# Patient Record
Sex: Female | Born: 1980 | Race: Asian | Hispanic: No | Marital: Married | Smoking: Never smoker
Health system: Southern US, Community
[De-identification: ages and names within clinical notes are randomized; demographics above are authoritative.]

## PROBLEM LIST (undated history)

## (undated) DIAGNOSIS — Z789 Other specified health status: Secondary | ICD-10-CM

## (undated) HISTORY — PX: HEMORRHOID SURGERY: SHX153

## (undated) HISTORY — PX: INDUCED ABORTION: SHX677

---

## 2008-05-04 ENCOUNTER — Emergency Department (HOSPITAL_COMMUNITY): Admission: EM | Admit: 2008-05-04 | Discharge: 2008-05-04 | Payer: Self-pay | Admitting: Emergency Medicine

## 2010-04-23 ENCOUNTER — Encounter: Payer: Self-pay | Admitting: Internal Medicine

## 2010-04-23 LAB — CONVERTED CEMR LAB: Pap Smear: NORMAL

## 2010-11-03 NOTE — L&D Delivery Note (Signed)
Patient was C/C/+2 and pushed for 5 minutes with epidural.   NSVD  female infant, Apgars 9,9, weight P.   The patient had one 2nd degree midline lacerations repaired with 2-0 vicryl R. Fundus was firm. EBL was expected. Placenta was delivered intact. Vagina was clear.  Baby was vigorous to bedside.  Phyllis Reed

## 2010-12-17 ENCOUNTER — Encounter: Payer: Self-pay | Admitting: Internal Medicine

## 2010-12-17 ENCOUNTER — Ambulatory Visit (INDEPENDENT_AMBULATORY_CARE_PROVIDER_SITE_OTHER): Payer: BC Managed Care – PPO | Admitting: Internal Medicine

## 2010-12-17 DIAGNOSIS — Z331 Pregnant state, incidental: Secondary | ICD-10-CM

## 2010-12-17 DIAGNOSIS — H6690 Otitis media, unspecified, unspecified ear: Secondary | ICD-10-CM

## 2010-12-20 LAB — RUBELLA ANTIBODY, IGM: Rubella: IMMUNE

## 2010-12-20 LAB — ANTIBODY SCREEN: Antibody Screen: NEGATIVE

## 2010-12-20 LAB — HEPATITIS B SURFACE ANTIGEN: Hepatitis B Surface Ag: NEGATIVE

## 2010-12-20 LAB — ABO/RH: RH Type: POSITIVE

## 2010-12-25 NOTE — Assessment & Plan Note (Signed)
Summary: New patient   Vital Signs:  Patient profile:   30 year old female Menstrual status:  regular LMP:     10/17/2010 Height:      63.25 inches Weight:      134 pounds BMI:     23.63 O2 Sat:      97 % on Room air Temp:     97.8 degrees F oral Pulse rate:   88 / minute Resp:     18 per minute BP sitting:   100 / 70  (right arm)  Vitals Entered By: Glendell Docker CMA (December 17, 2010 9:35 AM)  O2 Flow:  Room air CC: New Patient  Is Patient Diabetic? No Pain Assessment Patient in pain? no      Comments c/o nasal, head congestion , headches, [redacted] weeks pregnant, taken tylenol and Robitussin with little relief, ongoing for the past 2 weeks,  LMP (date): 10/17/2010     Menstrual Status regular Enter LMP: 10/17/2010 Last PAP Result normal   Primary Care Provider:  Dondra Spry DO  CC:  New Patient .  History of Present Illness: 30 y/o Asian female to establish she c/o cold symptoms sinus congestion, green nasal discharge headaches - top of head two tylenol helped helped left ear pain fever - 101 pt is [redacted] weeks pregnant  prev lived at [facility] [place] moved to [place] - closer to family  2nd child  normal 1st pregnancy no hx of preeclampsia Ethan had jaundice as infant      Preventive Screening-Counseling & Management  Alcohol-Tobacco     Alcohol drinks/day: 0     Smoking Status: never  Caffeine-Diet-Exercise     Caffeine use/day: 1-2 beverages daily     Does Patient Exercise: no  Allergies (verified): No Known Drug Allergies  Past History:  Past Surgical History: Denies surgical history  Family History: Family History Diabetes-mother Family History Hypertension-mother Family History of Stroke -father age 56  Social History: Occupation:  Probation officer Married 2.5 years 1 child and one on the waySmoking Status:  never Caffeine use/day:  1-2 beverages daily Does Patient Exercise:  no  Review of Systems       The  patient complains of fever.  The patient denies chest pain, hemoptysis, and severe indigestion/heartburn.    Physical Exam  General:  alert, well-developed, and well-nourished.   Head:  normocephalic and atraumatic.   Eyes:  pupils equal, pupils round, and pupils reactive to light.   Ears:  R ear normal.  Left TM red and retracted Mouth:  good dentition and pharyngeal erythema.   Neck:  No deformities, masses, or tenderness noted. Lungs:  normal respiratory effort, normal breath sounds, no crackles, and no wheezes.   Heart:  normal rate, regular rhythm, no murmur, and no gallop.   Abdomen:  soft, non-tender, normal bowel sounds, no masses, no hepatomegaly, and no splenomegaly.   Extremities:  No lower extremity edema Neurologic:  cranial nerves II-XII intact and gait normal.   Psych:  normally interactive and good eye contact.     Impression & Recommendations:  Problem # 1:  OTITIS MEDIA, LEFT (ICD-382.9)  Her updated medication list for this problem includes:    Cefuroxime Axetil 500 Mg Tabs (Cefuroxime axetil) ..... One by mouth two times a day  take abx as directed. Call if no improvement in 48-72 hours or sooner if worsening symptoms.   Problem # 2:  PREGNANCY (ICD-V22.2) pt taking pre natal vitamin  she has upcoming appt with OB I rec pt get flu vaccine  Complete Medication List: 1)  Prenatal Vitamins 0.8 Mg Tabs (Prenatal multivit-min-fe-fa) .... Take 1 tablet by mouth once a day 2)  Cefuroxime Axetil 500 Mg Tabs (Cefuroxime axetil) .... One by mouth two times a day  Patient Instructions: 1)  Call our office if your symptoms do not  improve or gets worse. 2)  Increase fluid intake Prescriptions: CEFUROXIME AXETIL 500 MG TABS (CEFUROXIME AXETIL) one by mouth two times a day  #20 x 0   Entered and Authorized by:   D. Thomos Lemons DO   Signed by:   D. Thomos Lemons DO on 12/17/2010   Method used:   Electronically to        [facility] [phone]* (retail)       [address]       [place], [place]  [address]       Ph: [phone]       Fax: [phone]   RxID:   [id number]    Orders Added: 1)  New Patient Level III [99203]   Immunization History:  Tetanus/Td Immunization History:    Tetanus/Td:  historical (04/19/2002)   Immunization History:  Tetanus/Td Immunization History:    Tetanus/Td:  Historical (04/19/2002)  Current Allergies (reviewed today): No known allergies     Preventive Care Screening  Pap Smear:    Date:  04/23/2010    Results:  normal   Last Tetanus Booster:    Date:  04/19/2002    Results:  Historical

## 2011-06-26 LAB — STREP B DNA PROBE: GBS: NEGATIVE

## 2011-07-26 ENCOUNTER — Encounter (HOSPITAL_COMMUNITY): Payer: Self-pay

## 2011-07-26 ENCOUNTER — Inpatient Hospital Stay (HOSPITAL_COMMUNITY)
Admission: AD | Admit: 2011-07-26 | Discharge: 2011-07-28 | DRG: 373 | Disposition: A | Discharge status: Home / Self Care | Payer: BC Managed Care – PPO | Source: Ambulatory Visit | Attending: Obstetrics and Gynecology | Admitting: Obstetrics and Gynecology

## 2011-07-26 HISTORY — DX: Other specified health status: Z78.9

## 2011-07-26 LAB — CBC
Hemoglobin: 13.9 g/dL (ref 12.0–15.0)
MCH: 29.8 pg (ref 26.0–34.0)
MCHC: 34.4 g/dL (ref 30.0–36.0)
MCV: 86.5 fL (ref 78.0–100.0)
Platelets: 233 10*3/uL (ref 150–400)
RBC: 4.67 MIL/uL (ref 3.87–5.11)

## 2011-07-26 MED ORDER — SODIUM CHLORIDE 0.9 % IV SOLN: 250.0000 mL | INTRAVENOUS | Status: DC

## 2011-07-26 MED ORDER — IBUPROFEN 600 MG PO TABS: 600.0000 mg | ORAL_TABLET | Freq: Four times a day (QID) | ORAL | Status: DC | PRN

## 2011-07-26 MED ORDER — SIMETHICONE 80 MG PO CHEW: 80.0000 mg | CHEWABLE_TABLET | ORAL | Status: DC | PRN

## 2011-07-26 MED ORDER — ZOLPIDEM TARTRATE 5 MG PO TABS: 5.0000 mg | ORAL_TABLET | Freq: Every evening | ORAL | Status: DC | PRN

## 2011-07-26 MED ORDER — TERBUTALINE SULFATE 1 MG/ML IJ SOLN: 0.2500 mg | Freq: Once | INTRAMUSCULAR | Status: DC | PRN

## 2011-07-26 MED ORDER — IBUPROFEN 800 MG PO TABS
800.0000 mg | ORAL_TABLET | Freq: Three times a day (TID) | ORAL | Status: DC
  Administered 2011-07-26 – 2011-07-27 (×4): 800 mg via ORAL
  Filled 2011-07-26 (×5): qty 1

## 2011-07-26 MED ORDER — OXYCODONE-ACETAMINOPHEN 5-325 MG PO TABS: 2.0000 | ORAL_TABLET | ORAL | Status: DC | PRN

## 2011-07-26 MED ORDER — MAGNESIUM HYDROXIDE 400 MG/5ML PO SUSP: 30.0000 mL | ORAL | Status: DC | PRN

## 2011-07-26 MED ORDER — LACTATED RINGERS IV SOLN
INTRAVENOUS | Status: DC
  Administered 2011-07-26: 12:00:00 via INTRAVENOUS

## 2011-07-26 MED ORDER — METHYLERGONOVINE MALEATE 0.2 MG/ML IJ SOLN: 0.2000 mg | INTRAMUSCULAR | Status: DC | PRN

## 2011-07-26 MED ORDER — FLEET ENEMA 7-19 GM/118ML RE ENEM: 1.0000 | ENEMA | RECTAL | Status: DC | PRN

## 2011-07-26 MED ORDER — SENNOSIDES-DOCUSATE SODIUM 8.6-50 MG PO TABS
2.0000 | ORAL_TABLET | Freq: Every day | ORAL | Status: DC
  Administered 2011-07-26 – 2011-07-27 (×2): 2 via ORAL

## 2011-07-26 MED ORDER — OXYCODONE-ACETAMINOPHEN 5-325 MG PO TABS
1.0000 | ORAL_TABLET | ORAL | Status: DC | PRN
Start: 2011-07-26 — End: 2011-07-28
  Administered 2011-07-27 (×2): 1 via ORAL
  Filled 2011-07-26 (×2): qty 1

## 2011-07-26 MED ORDER — CITRIC ACID-SODIUM CITRATE 334-500 MG/5ML PO SOLN: 30.0000 mL | ORAL | Status: DC | PRN

## 2011-07-26 MED ORDER — ONDANSETRON HCL 4 MG/2ML IJ SOLN: 4.0000 mg | Freq: Four times a day (QID) | INTRAMUSCULAR | Status: DC | PRN

## 2011-07-26 MED ORDER — SODIUM CHLORIDE 0.9 % IJ SOLN
3.0000 mL | Freq: Two times a day (BID) | INTRAMUSCULAR | Status: DC
  Administered 2011-07-26: 3 mL via INTRAVENOUS

## 2011-07-26 MED ORDER — FERROUS SULFATE 325 (65 FE) MG PO TABS
325.0000 mg | ORAL_TABLET | Freq: Two times a day (BID) | ORAL | Status: DC
  Administered 2011-07-27 – 2011-07-28 (×3): 325 mg via ORAL
  Filled 2011-07-26 (×3): qty 1

## 2011-07-26 MED ORDER — TETANUS-DIPHTH-ACELL PERTUSSIS 5-2.5-18.5 LF-MCG/0.5 IM SUSP: 0.5000 mL | Freq: Once | INTRAMUSCULAR | Status: DC

## 2011-07-26 MED ORDER — BENZOCAINE-MENTHOL 20-0.5 % EX AERO: 1.0000 "application " | INHALATION_SPRAY | CUTANEOUS | Status: DC | PRN

## 2011-07-26 MED ORDER — DIBUCAINE 1 % RE OINT: 1.0000 "application " | TOPICAL_OINTMENT | RECTAL | Status: DC | PRN

## 2011-07-26 MED ORDER — MEASLES, MUMPS & RUBELLA VAC ~~LOC~~ INJ: 0.5000 mL | INJECTION | Freq: Once | SUBCUTANEOUS | Status: DC

## 2011-07-26 MED ORDER — METHYLERGONOVINE MALEATE 0.2 MG PO TABS: 0.2000 mg | ORAL_TABLET | ORAL | Status: DC | PRN

## 2011-07-26 MED ORDER — PRENATAL PLUS 27-1 MG PO TABS
1.0000 | ORAL_TABLET | Freq: Every day | ORAL | Status: DC
  Administered 2011-07-27 – 2011-07-28 (×2): 1 via ORAL
  Filled 2011-07-26 (×2): qty 1

## 2011-07-26 MED ORDER — DIPHENHYDRAMINE HCL 25 MG PO CAPS: 25.0000 mg | ORAL_CAPSULE | Freq: Four times a day (QID) | ORAL | Status: DC | PRN

## 2011-07-26 MED ORDER — SODIUM CHLORIDE 0.9 % IJ SOLN: 3.0000 mL | INTRAMUSCULAR | Status: DC | PRN

## 2011-07-26 MED ORDER — ONDANSETRON HCL 4 MG PO TABS: 4.0000 mg | ORAL_TABLET | ORAL | Status: DC | PRN

## 2011-07-26 MED ORDER — ACETAMINOPHEN 325 MG PO TABS: 650.0000 mg | ORAL_TABLET | ORAL | Status: DC | PRN

## 2011-07-26 MED ORDER — BENZOCAINE-MENTHOL 20-0.5 % EX AERO
INHALATION_SPRAY | CUTANEOUS | Status: AC
  Filled 2011-07-26: qty 56

## 2011-07-26 MED ORDER — LANOLIN HYDROUS EX OINT: TOPICAL_OINTMENT | CUTANEOUS | Status: DC | PRN

## 2011-07-26 MED ORDER — OXYTOCIN 20 UNITS IN LACTATED RINGERS INFUSION - SIMPLE: 125.0000 mL/h | INTRAVENOUS | Status: DC | PRN

## 2011-07-26 MED ORDER — OXYTOCIN BOLUS FROM INFUSION
500.0000 mL | Freq: Once | INTRAVENOUS | Status: DC
  Filled 2011-07-26: qty 500

## 2011-07-26 MED ORDER — OXYTOCIN 20 UNITS IN LACTATED RINGERS INFUSION - SIMPLE
125.0000 mL/h | Freq: Once | INTRAVENOUS | Status: AC
  Administered 2011-07-26: 500 mL/h via INTRAVENOUS
  Filled 2011-07-26: qty 1000

## 2011-07-26 MED ORDER — LIDOCAINE HCL (PF) 1 % IJ SOLN
30.0000 mL | INTRAMUSCULAR | Status: DC | PRN
  Administered 2011-07-26: 30 mL via SUBCUTANEOUS
  Filled 2011-07-26: qty 30

## 2011-07-26 MED ORDER — LACTATED RINGERS IV SOLN: 500.0000 mL | INTRAVENOUS | Status: DC | PRN

## 2011-07-26 MED ORDER — ONDANSETRON HCL 4 MG/2ML IJ SOLN: 4.0000 mg | INTRAMUSCULAR | Status: DC | PRN

## 2011-07-26 MED ORDER — OXYTOCIN 20 UNITS IN LACTATED RINGERS INFUSION - SIMPLE
1.0000 m[IU]/min | INTRAVENOUS | Status: DC
  Administered 2011-07-26: 2 m[IU]/min via INTRAVENOUS

## 2011-07-26 NOTE — Progress Notes (Signed)
Nursery RN at bedside assessing NBI

## 2011-07-26 NOTE — H&P (Signed)
30 y.o. at 39 2/7 weeks  G3P1011 comes in c/o SROM at 0600 am.  Otherwise has good fetal movement and no bleeding.  Past Medical History  Diagnosis Date  . No pertinent past medical history     Past Surgical History  Procedure Date  . Induced abortion   . Hemorrhoid surgery     OB History    Grav Para Term Preterm Abortions TAB SAB Ect Mult Living   3 1 1  0 1 1 0 0 0 1     # Outc Date GA Lbr Len/2nd Wgt Sex Del Anes PTL Lv   1 TRM 5/10    M SVD None No Yes   2 TAB            3 CUR               History   Social History  . Marital Status: Married    Spouse Name: N/A    Number of Children: N/A  . Years of Education: N/A   Occupational History  . Not on file.   Social History Main Topics  . Smoking status: Never Smoker   . Smokeless tobacco: Never Used  . Alcohol Use: No  . Drug Use: No  . Sexually Active: Yes    Birth Control/ Protection: None   Other Topics Concern  . Not on file   Social History Narrative  . No narrative on file   Review of patient's allergies indicates no known allergies.   Prenatal Course:  Uncomplicated.  FOB +Hep B but pt negative on prenatals.  Filed Vitals:   07/26/11 1632  BP: 112/74  Pulse: 119  Temp:   Resp:      Lungs/Cor:  NAD Abdomen:  soft, gravid Ex:  no cords, erythema SVE:  9/C/+2 per nurse now FHTs:  140s, good STV, NST R Toco:  q3-5   A/P   Term SROM, inlabor after pitocin augmentation.  GBS neg  Maitri Schnoebelen A

## 2011-07-26 NOTE — Progress Notes (Signed)
1610 gush - soaked underwear, followed by trickleing- denies current leakage.  Has had a little spotting. No pain

## 2011-07-26 NOTE — Progress Notes (Signed)
Patient was C/C/+2 and pushed for 5 minutes with epidural.   NSVD  female infant, Apgars 9,9, weight P.   The patient had one 2nd degree midline lacerations repaired with 2-0 vicryl R. Fundus was firm. EBL was expected. Placenta was delivered intact. Vagina was clear.  Baby was vigorous to bedside.  Phyllis Reed   

## 2011-07-26 NOTE — Progress Notes (Signed)
Water broke around 6:30 a.m. Clear fluid had some spotting in toilet denies contractions

## 2011-07-27 LAB — CBC
HCT: 36.9 % (ref 36.0–46.0)
Hemoglobin: 12.7 g/dL (ref 12.0–15.0)
MCH: 29.8 pg (ref 26.0–34.0)
MCHC: 34.4 g/dL (ref 30.0–36.0)
RBC: 4.26 MIL/uL (ref 3.87–5.11)

## 2011-07-27 NOTE — Progress Notes (Signed)
Patient is eating, ambulating, voiding.  Pain control is good.  Filed Vitals:   07/26/11 1846 07/26/11 2000 07/26/11 2059 07/27/11 0122  BP: 137/55 123/78 117/74 107/72  Pulse: 71 61 85 76  Temp:  98.4 F (36.9 C) 98.4 F (36.9 C) 97.9 F (36.6 C)  TempSrc:  Oral Oral Oral  Resp:  16 16 16   Height:      Weight:      SpO2:        Fundus firm Perineum without swelling.  Lab Results  Component Value Date   WBC 15.2* 07/27/2011   HGB 12.7 07/27/2011   HCT 36.9 07/27/2011   MCV 86.6 07/27/2011   PLT 233 07/27/2011    B/Positive/-- (02/17 0000)/RI  A/P Post partum day 1.  Routine care.  Expect d/c per plan.    Wister Hoefle A

## 2011-07-28 MED ORDER — HYDROCODONE-ACETAMINOPHEN 5-500 MG PO TABS: 1.0000 | ORAL_TABLET | ORAL | Status: AC | PRN

## 2011-07-28 MED ORDER — IBUPROFEN 600 MG PO TABS: 600.0000 mg | ORAL_TABLET | Freq: Four times a day (QID) | ORAL | Status: AC | PRN

## 2011-07-28 NOTE — Discharge Summary (Signed)
Obstetric Discharge Summary Reason for Admission: onset of labor Prenatal Procedures: ultrasound Intrapartum Procedures: spontaneous vaginal delivery Postpartum Procedures: none Complications-Operative and Postpartum: 2nd degree perineal laceration Hemoglobin  Date Value Range Status  07/27/2011 12.7  12.0-15.0 (g/dL) Final     HCT  Date Value Range Status  07/27/2011 36.9  36.0-46.0 (%) Final    Discharge Diagnoses: Term Pregnancy-delivered  Discharge Information: Date: 07/28/2011 Activity: pelvic rest Diet: routine Medications: ibuprofen and vicodin Condition: stable Instructions: refer to practice specific booklet Discharge to: home Follow-up Information    Make an appointment with HORVATH,MICHELLE A. (for post partum evaluation)    Contact information:   [address] [place] [address] [phone]          Newborn Data: Live born female  Birth Weight: 7 lb 13.4 oz (3555 g) APGAR: 9, 9  Home with mother.  Bryceton Hantz H. 07/28/2011, 10:40 AM

## 2011-07-28 NOTE — Progress Notes (Addendum)
Post Partum Day 1  Subjective: no complaints, up ad lib, voiding, tolerating PO and + flatus  Objective: Blood pressure 100/60, pulse 69, temperature 98.6 F (37 C), temperature source Oral, resp. rate 18, height 5\' 2"  (1.575 m), weight 72.848 kg (160 lb 9.6 oz), SpO2 95.00%, unknown if currently breastfeeding.  Physical Exam:  General: alert, cooperative, appears stated age and no distress Lochia: appropriate Uterine Fundus: firm DVT Evaluation: No evidence of DVT seen on physical exam.   Basename 07/27/11 0525 07/26/11 1208  HGB 12.7 13.9  HCT 36.9 40.4    Assessment/Plan: Routine Post Partum care Pt desires D/C home today Breast and Bottle feeding   LOS: 2 days   Phyllis Reed H. 07/28/2011, 10:14 AM

## 2011-07-31 LAB — RAPID URINE DRUG SCREEN, HOSP PERFORMED
Amphetamines: NOT DETECTED
Benzodiazepines: NOT DETECTED
Tetrahydrocannabinol: NOT DETECTED

## 2011-07-31 LAB — POCT PREGNANCY, URINE: Preg Test, Ur: NEGATIVE

## 2011-07-31 LAB — ETHANOL: Alcohol, Ethyl (B): 207 — ABNORMAL HIGH

## 2014-09-04 ENCOUNTER — Encounter (HOSPITAL_COMMUNITY): Payer: Self-pay

## 2016-05-27 LAB — OB RESULTS CONSOLE RUBELLA ANTIBODY, IGM: Rubella: IMMUNE

## 2016-05-27 LAB — OB RESULTS CONSOLE RPR: RPR: NONREACTIVE

## 2016-05-27 LAB — OB RESULTS CONSOLE HIV ANTIBODY (ROUTINE TESTING): HIV: NONREACTIVE

## 2016-05-27 LAB — OB RESULTS CONSOLE GC/CHLAMYDIA
CHLAMYDIA, DNA PROBE: NEGATIVE
Gonorrhea: NEGATIVE

## 2016-05-27 LAB — OB RESULTS CONSOLE ABO/RH: RH Type: POSITIVE

## 2016-05-27 LAB — OB RESULTS CONSOLE ANTIBODY SCREEN: ANTIBODY SCREEN: NEGATIVE

## 2016-05-27 LAB — OB RESULTS CONSOLE HEPATITIS B SURFACE ANTIGEN: Hepatitis B Surface Ag: NEGATIVE

## 2016-06-05 ENCOUNTER — Other Ambulatory Visit (HOSPITAL_COMMUNITY): Payer: Self-pay | Admitting: Obstetrics & Gynecology

## 2016-06-05 DIAGNOSIS — Z3A13 13 weeks gestation of pregnancy: Secondary | ICD-10-CM

## 2016-06-05 DIAGNOSIS — O283 Abnormal ultrasonic finding on antenatal screening of mother: Secondary | ICD-10-CM

## 2016-06-09 ENCOUNTER — Encounter (HOSPITAL_COMMUNITY): Payer: Self-pay | Admitting: *Deleted

## 2016-06-10 ENCOUNTER — Encounter (HOSPITAL_COMMUNITY): Payer: Self-pay

## 2016-06-10 ENCOUNTER — Ambulatory Visit (HOSPITAL_COMMUNITY)
Admission: RE | Admit: 2016-06-10 | Discharge: 2016-06-10 | Disposition: A | Discharge status: Home / Self Care | Payer: 59 | Source: Ambulatory Visit | Attending: Obstetrics & Gynecology | Admitting: Obstetrics & Gynecology

## 2016-06-10 VITALS — BP 120/65 | HR 90 | Wt 148.4 lb

## 2016-06-10 DIAGNOSIS — O283 Abnormal ultrasonic finding on antenatal screening of mother: Secondary | ICD-10-CM | POA: Diagnosis not present

## 2016-06-10 DIAGNOSIS — Z3A13 13 weeks gestation of pregnancy: Secondary | ICD-10-CM | POA: Diagnosis not present

## 2016-06-10 NOTE — Progress Notes (Signed)
Genetic Counseling  High-Risk Gestation Note  Appointment Date:  06/10/2016 Referred By: Essie HartPinn, Walda, MD Date of Birth:  01-Feb-1981 Partner: Lynetta MareHai Dang   Pregnancy History: [id number]: G4P2012 Estimated Date of Delivery: 12/13/16 Estimated Gestational Age: 1863w3d Attending: Alpha GulaPaul Whitecar, MD   Ms. Verta Ellenrang Penick and her husband, Mr. Lynetta MareHai Dang, were seen for genetic counseling because of ultrasound finding of increased nuchal translucency.    In summary:  Reviewed ultrasound finding of increased nuchal translucency and the association with fetal aneuploidy, single gene conditions, structural defects  Discussed significance of prior screening for fetal aneuploidy  NIPS (Panorama) within normal range but no results for monosomy X  Offered additional screening  NIPS- pt pursued redraw for monosomy X assessment  NIPS for single gene conditions- declined today, but may consider pending results of current testing  Expanded carrier screening- declined today  Detailed ultrasound- scheduled for 07/15/16  Fetal echocardiogram- scheduled for 07/22/16 with [facility] [place]  Discussed option of diagnostic testing  Amniocentesis-declined  Reviewed family history concerns  Ultrasound was performed today, and the fetal nuchal translucency (NT) measured 3.7 mm.  We discussed that the fetal NT refers to a fluid filled space between the skin and soft tissues behind the cervical spine.  This space is traditionally measurable between 11 and 13.[redacted] weeks gestation and is considered enlarged at ~3 mm or greater.  This couple was counseled regarding the various common etiologies for an enlarged NT including: aneuploidy, single gene conditions, cardiac or great vessel abnormalities, lymphatic system failure, decreased fetal movement, and fetal anemia.    We reviewed chromosomes, nondisjunction, and the common features and prognoses of Down syndrome, trisomy 2718, trisomy 913, and monosomy X.  Considering  Ms. Valek's age, an NT of 3.7, the risk for fetal aneuploidy is estimated to be ~1 in 20 (5%).   In addition, we discussed that an NT of 3.7 is associated with an approximate 3% risk for a fetal cardiac anomaly.  We also discussed single gene conditions and that these conditions are not routinely tested for prenatally unless ultrasound findings or family history significantly increase the suspicion of a specific single gene disorder.  We briefly reviewed common inheritance patterns (dominant, recessive, and X-linked) as well as the associated risks of recurrence.     Ms. Greggory Stallionrinh previously had noninvasive prenatal screening (NIPS)/prenatal cell free DNA testing performed through her OB office. Specifically, she had Panorama through [facility]. This screen was within normal range and indicated low risk for fetal trisomy 21, trisomy 2718, trisomy 5413, 22q11.2 deletion syndrome, and triploidy. Results were not able to be obtained regarding risk assessment for monosomy X. We reviewed the methodology of this screen, the sensitivity, specificity and false positive rates for this screen. They understand that NIPS is not diagnostic and does not assess for all chromosome conditions. Ms. Greggory Stallionrinh elected to pursue a second specimen for monosomy X risk analysis today. The couple understands that a second sample could potentially also yield uninformative results.   We reviewed the availability of diagnostic testing (chromosome analysis) via [facility] and amniocentesis.  She was counseled regarding the associated risks for complications for each. We discussed the associated 1 in 300-500 risk for amniocentesis, including the risk for spontaneous pregnancy loss.  Additionally, we discussed that microarray analysis can be performed on cells obtained from amniocentesis. They were counseled that microarray analysis is a molecular based technique in which a test sample of DNA (fetal) is compared to a reference (normal) genome in order to  determine if the test sample has any extra or missing genetic information.  Microarray analysis allows for the detection of genetic deletions and duplications that are 100 times smaller than those identified by routine chromosome analysis.  We discussed that available medical literature show that approximately 6% of patients with an abnormal fetal ultrasound and a normal fetal karyotype had a significant microdeletion/microduplication detected by prenatal microarray analysis. The couple declined amniocentesis at this time.   We also discussed that a cystic hygroma can be a feature of many underlying single gene conditions, such as Noonan syndrome, SLOS, and skeletal dysplasias.  We discussed that prenatal diagnosis is available for some single gene conditions, but that in most cases targeted analysis is recommended by a medical geneticist following a post-natal physical exam and review of medical records.  If however, ultrasound findings, a positive family history, or other screening strongly suggest an increased risk for a specific condition, testing may be performed prenatally.  Ms. Isaura Schiller was scheduled to have an ultrasound on 07/15/16.  Fetal echocardiogram is scheduled for 07/22/16 with [facility].   Regarding screening options for single gene conditions, we discussed a newer clinically available NIPS option, [facility] through [facility] This NIPS platform assesses for a select panel of 30 single genes, corresponding to mostly de novo autosomal dominant or X-linked dominant conditions including skeletal dysplasias and Noonan syndrome. We discussed the range of detection rates. Maternal and paternal samples are required for single gene NIPS. We reviewed the benefits and limitations of this screening options and possible results including positive, negative, unanticipated results, and no result. The couple declined single gene NIPS at this time, but may consider pursuing at a later  date in the pregnancy.   Regarding available screening for autosomal recessive conditions, we discussed the option of expanded carrier screening for the couple. We reviewed that ACOG currently recommends that all patients be offered carrier screening for cystic fibrosis, spinal muscular atrophy and hemoglobinopathies. In addition, they were counseled that there are a variety of genetic screening laboratories that have pan-ethnic, or expanded, carrier screening panels, which evaluate carrier status for a wide range of genetic conditions. Some of these conditions are severe and actionable, but also rare; others occur more commonly, but are less severe. We discussed that testing options range from screening for a single condition to panels of more than 200 autosomal or X-linked genetic conditions. We reviewed that the prevalence of each condition varies (and often varies with ethnicity). Thus the couples' background risk to be a carrier for each of these various conditions would range, and in some cases be very low or unknown. Similarly, the detection rate varies with each condition and also varies in some cases with ethnicity, ranging from greater than 99% (in the case of hemoglobinopathies) to unknown. We reviewed that a negative carrier screen would thus reduce, but not eliminate the chance to be a carrier for these conditions.  We discussed the risks, benefits, and limitations of carrier screening with the couple. After thoughtful consideration of their options, this couple declined expanded carrier screening at this time.    We discussed that the fetal prognosis is largely dependent upon the underlying cause of the increased nuchal translucency.  They were counseled that the majority of fetuses with increased nuchal translucency that have a normal karyotype, normal detailed anatomy ultrasound, and normal fetal echocardiogram will have an uneventful outcome.  She understands that while many of the causes of an  increased nuchal translucency can be  investigated during pregnancy, not all conditions and causes can be determined prenatally.    Ms. Saher Davee was provided with written information regarding cystic fibrosis (CF), spinal muscular atrophy (SMA) and hemoglobinopathies including the carrier frequency, availability of carrier screening and prenatal diagnosis if indicated.  In addition, we discussed that CF and hemoglobinopathies are routinely screened for as part of the [place] newborn screening panel.  After further discussion, she declined screening for CF, SMA and hemoglobinopathies.  Both family histories were reviewed and found to be contributory for a nephew to Mr. Laney Potash with a congenital heart defect. He was diagnosed postnatally, and is being followed by cardiology. He is currently 58 months old and was reportedly born a few weeks early. He is otherwise healthy, and the hole in the heart is anticipated to resolve on its own. Congenital heart defects occur in approximately 1% of pregnancies. Congenital heart defects may occur due to multifactorial influences, chromosomal abnormalities, genetic syndromes or environmental exposures.  Isolated heart defects are generally multifactorial. The reported family history would not be expected to increased risk for congenital heart defects for the current pregnancy, assuming multifactorial inheritance for this relative. Without further information regarding the provided family history, an accurate genetic risk cannot be calculated. Further genetic counseling is warranted if more information is obtained.  Ms. Swetha Rayle denied exposure to environmental toxins or chemical agents. She denied the use of alcohol, tobacco or street drugs. She denied significant viral illnesses during the course of her pregnancy. Her medical and surgical histories were noncontributory.   I counseled this couple regarding the above risks and available options.  The approximate face-to-face  time with the genetic counselor was 40 minutes.   Quinn Plowman, MS Certified Genetic Counselor 06/10/2016

## 2016-06-11 ENCOUNTER — Other Ambulatory Visit (HOSPITAL_COMMUNITY): Payer: Self-pay

## 2016-06-11 ENCOUNTER — Encounter (HOSPITAL_COMMUNITY): Payer: Self-pay

## 2016-06-17 ENCOUNTER — Telehealth (HOSPITAL_COMMUNITY): Payer: Self-pay | Admitting: MS"

## 2016-06-17 NOTE — Telephone Encounter (Signed)
Called Ms. Phyllis Reed regarding results of prenatal cell free DNA testing. Second sample was sent for Panorama through [facility] to assess monosomy X.  Patient identified by name and DOB. Discussed that these are low risk for monosomy X. All questions answered to her satisfaction at this time. She understands that this does not assess for all genetic or chromosome conditions.   Clydie BraunKaren Kirsta Probert 06/17/2016 3:13 PM

## 2016-06-18 ENCOUNTER — Other Ambulatory Visit (HOSPITAL_COMMUNITY): Payer: Self-pay

## 2016-07-15 ENCOUNTER — Ambulatory Visit (HOSPITAL_COMMUNITY)
Admission: RE | Admit: 2016-07-15 | Discharge: 2016-07-15 | Disposition: A | Discharge status: Home / Self Care | Payer: 59 | Source: Ambulatory Visit | Attending: Obstetrics & Gynecology | Admitting: Obstetrics & Gynecology

## 2016-07-15 ENCOUNTER — Encounter (HOSPITAL_COMMUNITY): Payer: Self-pay

## 2016-07-15 DIAGNOSIS — Z3A19 19 weeks gestation of pregnancy: Secondary | ICD-10-CM | POA: Insufficient documentation

## 2016-07-15 DIAGNOSIS — O283 Abnormal ultrasonic finding on antenatal screening of mother: Secondary | ICD-10-CM | POA: Diagnosis not present

## 2016-07-25 ENCOUNTER — Encounter (HOSPITAL_COMMUNITY): Payer: Self-pay

## 2016-09-04 LAB — OB RESULTS CONSOLE RPR: RPR: NONREACTIVE

## 2016-11-03 NOTE — L&D Delivery Note (Signed)
Pt presented to the hospital in labor. She progressed along a nl labor curve. She pushed briefly and had a SVD of one live viable infant over a second degree midline tear in the ROA position. Placenta- S/I. EBL 400cc. Tear closed with 3.0 chromic. Baby to NBN.

## 2016-11-17 LAB — OB RESULTS CONSOLE GBS: STREP GROUP B AG: POSITIVE

## 2016-12-09 ENCOUNTER — Inpatient Hospital Stay (HOSPITAL_COMMUNITY)
Admission: RE | Admit: 2016-12-09 | Discharge: 2016-12-11 | DRG: 775 | Disposition: A | Discharge status: Home / Self Care | Payer: 59 | Attending: Obstetrics and Gynecology | Admitting: Obstetrics and Gynecology

## 2016-12-09 ENCOUNTER — Encounter (HOSPITAL_COMMUNITY): Payer: Self-pay

## 2016-12-09 DIAGNOSIS — Z3493 Encounter for supervision of normal pregnancy, unspecified, third trimester: Secondary | ICD-10-CM | POA: Diagnosis not present

## 2016-12-09 DIAGNOSIS — Z3A39 39 weeks gestation of pregnancy: Secondary | ICD-10-CM

## 2016-12-09 DIAGNOSIS — O99824 Streptococcus B carrier state complicating childbirth: Secondary | ICD-10-CM | POA: Diagnosis not present

## 2016-12-09 DIAGNOSIS — Z3490 Encounter for supervision of normal pregnancy, unspecified, unspecified trimester: Secondary | ICD-10-CM

## 2016-12-09 LAB — CBC
HCT: 42.3 % (ref 36.0–46.0)
HEMOGLOBIN: 15.3 g/dL — AB (ref 12.0–15.0)
MCH: 30.7 pg (ref 26.0–34.0)
MCHC: 36.2 g/dL — ABNORMAL HIGH (ref 30.0–36.0)
MCV: 84.8 fL (ref 78.0–100.0)
Platelets: 247 10*3/uL (ref 150–400)
RBC: 4.99 MIL/uL (ref 3.87–5.11)
RDW: 13.5 % (ref 11.5–15.5)
WBC: 12.2 10*3/uL — ABNORMAL HIGH (ref 4.0–10.5)

## 2016-12-09 LAB — ABO/RH: ABO/RH(D): B POS

## 2016-12-09 LAB — TYPE AND SCREEN
ABO/RH(D): B POS
Antibody Screen: NEGATIVE

## 2016-12-09 MED ORDER — OXYTOCIN 40 UNITS IN LACTATED RINGERS INFUSION - SIMPLE MED: 2.5000 [IU]/h | INTRAVENOUS | Status: DC

## 2016-12-09 MED ORDER — LACTATED RINGERS IV SOLN
INTRAVENOUS | Status: DC
  Administered 2016-12-09: 06:00:00 via INTRAVENOUS

## 2016-12-09 MED ORDER — PENICILLIN G POT IN DEXTROSE 60000 UNIT/ML IV SOLN
3.0000 10*6.[IU] | INTRAVENOUS | Status: DC
  Filled 2016-12-09 (×2): qty 50

## 2016-12-09 MED ORDER — ONDANSETRON HCL 4 MG/2ML IJ SOLN: 4.0000 mg | INTRAMUSCULAR | Status: DC | PRN

## 2016-12-09 MED ORDER — ONDANSETRON HCL 4 MG/2ML IJ SOLN: 4.0000 mg | Freq: Four times a day (QID) | INTRAMUSCULAR | Status: DC | PRN

## 2016-12-09 MED ORDER — WITCH HAZEL-GLYCERIN EX PADS: 1.0000 "application " | MEDICATED_PAD | CUTANEOUS | Status: DC | PRN

## 2016-12-09 MED ORDER — FLEET ENEMA 7-19 GM/118ML RE ENEM: 1.0000 | ENEMA | RECTAL | Status: DC | PRN

## 2016-12-09 MED ORDER — SOD CITRATE-CITRIC ACID 500-334 MG/5ML PO SOLN: 30.0000 mL | ORAL | Status: DC | PRN

## 2016-12-09 MED ORDER — ACETAMINOPHEN 325 MG PO TABS
650.0000 mg | ORAL_TABLET | ORAL | Status: DC | PRN
Start: 2016-12-09 — End: 2016-12-09

## 2016-12-09 MED ORDER — LACTATED RINGERS IV SOLN: 500.0000 mL | INTRAVENOUS | Status: DC | PRN

## 2016-12-09 MED ORDER — PENICILLIN G POTASSIUM 5000000 UNITS IJ SOLR
5.0000 10*6.[IU] | Freq: Once | INTRAVENOUS | Status: AC
  Administered 2016-12-09: 5 10*6.[IU] via INTRAVENOUS
  Filled 2016-12-09: qty 5

## 2016-12-09 MED ORDER — BENZOCAINE-MENTHOL 20-0.5 % EX AERO
1.0000 "application " | INHALATION_SPRAY | CUTANEOUS | Status: DC | PRN
  Administered 2016-12-09: 1 via TOPICAL
  Filled 2016-12-09: qty 56

## 2016-12-09 MED ORDER — OXYTOCIN BOLUS FROM INFUSION
500.0000 mL | Freq: Once | INTRAVENOUS | Status: AC
  Administered 2016-12-09: 500 mL via INTRAVENOUS

## 2016-12-09 MED ORDER — DIBUCAINE 1 % RE OINT: 1.0000 "application " | TOPICAL_OINTMENT | RECTAL | Status: DC | PRN

## 2016-12-09 MED ORDER — OXYCODONE-ACETAMINOPHEN 5-325 MG PO TABS: 1.0000 | ORAL_TABLET | ORAL | Status: DC | PRN

## 2016-12-09 MED ORDER — SENNOSIDES-DOCUSATE SODIUM 8.6-50 MG PO TABS
2.0000 | ORAL_TABLET | ORAL | Status: DC
  Administered 2016-12-09 – 2016-12-10 (×2): 2 via ORAL
  Filled 2016-12-09: qty 2

## 2016-12-09 MED ORDER — OXYCODONE-ACETAMINOPHEN 5-325 MG PO TABS: 2.0000 | ORAL_TABLET | ORAL | Status: DC | PRN

## 2016-12-09 MED ORDER — MEASLES, MUMPS & RUBELLA VAC ~~LOC~~ INJ
0.5000 mL | INJECTION | Freq: Once | SUBCUTANEOUS | Status: DC
  Filled 2016-12-09: qty 0.5

## 2016-12-09 MED ORDER — ACETAMINOPHEN 325 MG PO TABS: 650.0000 mg | ORAL_TABLET | ORAL | Status: DC | PRN

## 2016-12-09 MED ORDER — ZOLPIDEM TARTRATE 5 MG PO TABS: 5.0000 mg | ORAL_TABLET | Freq: Every evening | ORAL | Status: DC | PRN

## 2016-12-09 MED ORDER — IBUPROFEN 600 MG PO TABS
600.0000 mg | ORAL_TABLET | Freq: Four times a day (QID) | ORAL | Status: DC
  Administered 2016-12-09 – 2016-12-10 (×7): 600 mg via ORAL
  Filled 2016-12-09 (×4): qty 1

## 2016-12-09 MED ORDER — TETANUS-DIPHTH-ACELL PERTUSSIS 5-2.5-18.5 LF-MCG/0.5 IM SUSP: 0.5000 mL | Freq: Once | INTRAMUSCULAR | Status: DC

## 2016-12-09 MED ORDER — LIDOCAINE HCL (PF) 1 % IJ SOLN
30.0000 mL | INTRAMUSCULAR | Status: AC | PRN
  Administered 2016-12-09: 30 mL via SUBCUTANEOUS
  Filled 2016-12-09: qty 30

## 2016-12-09 MED ORDER — SIMETHICONE 80 MG PO CHEW: 80.0000 mg | CHEWABLE_TABLET | ORAL | Status: DC | PRN

## 2016-12-09 MED ORDER — ONDANSETRON HCL 4 MG PO TABS: 4.0000 mg | ORAL_TABLET | ORAL | Status: DC | PRN

## 2016-12-09 MED ORDER — COCONUT OIL OIL: 1.0000 "application " | TOPICAL_OIL | Status: DC | PRN

## 2016-12-09 NOTE — Plan of Care (Signed)
Problem: Activity: Goal: Ability to tolerate increased activity will improve Outcome: Completed/Met Date Met: 12/09/16 Pt able to ambulate independently in the room.  Pt encouraged to ambulate in the hallways.   Problem: Urinary Elimination: Goal: Ability to reestablish a normal urinary elimination pattern will improve Outcome: Completed/Met Date Met: 12/09/16 Pt voiding without difficulty.

## 2016-12-09 NOTE — MAU Note (Signed)
Pt presents complaining of contractions every 5 minutes since 2300. Denies leaking or bleeding. Reports good fetal movement.

## 2016-12-09 NOTE — Progress Notes (Signed)
Orders received for admission, PCN for GBS.

## 2016-12-09 NOTE — Lactation Note (Signed)
This note was copied from a baby's chart. Lactation Consultation Note Mom [facility].  Mom states that baby is breastfeeding very well and has been since delivery.  Mom has a 34 year old that she breastfed for 1 month then bottle fed her milk for 3 months.  She has a 34 year old that she breastfed for 6 months then strictly pumped and bottle fed her breastmilk for 1 year.  Mom pumps in order to go back.  She states that she has a medela advance pump but cannot locate the tubing and would like to get new tubing in order to pump for this child.  LC explored her desire to pump and mom stated that she will go back to work in a couple of months and doesn't plan on pumping until prior to going back.  She plans on putting baby to the best.  She expressed that this baby is already nursing much better than her previous two children.  LC reviewed importance of latching infant deeply to prevent soreness and to also ensure that the baby was transferring colostrum,  Mom was taught hand expression and gtts of colostrum were noted. Upon baby having temp taken, baby detached and began fussing.  LC offered to show mom the football hold.  She wanted LC to show her on opposite breast so LC provided pillows and blankets in order to give proper support when positioning infant.  Dad also helped with this.  Baby latched easily in football hold.  Cheeks were flush to breast and swallows were heard with breast massage and compression.  Mom nursed baby in this position for 15 minutes until baby fell asleep on breast.  Mom stated she felt no pinching but tugs.  LC noted during feed that baby had rhythmic jaw movement.  LC encouraged mom and dad to do STS often and to hand express prior to and after feeds.  LC instructed parents to feed infant 8/12 times minimum in a 24 hour period.  [facility] broch[facility] was given along with information sheet on BF resources and BF support groups.  Mom encouraged to call out with  further questions or concerns regarding breastfeeding.     Patient Name: Phyllis Reed FAOZH'YToday's Date: 12/09/2016 Reason for consult: Initial assessment   Maternal Data Formula Feeding for Exclusion: No Has patient been taught Hand Expression?: Yes Does the patient have breastfeeding experience prior to this delivery?: Yes  Feeding Feeding Type: Breast Milk Length of feed: 0 min  LATCH Score/Interventions Latch: Grasps breast easily, tongue down, lips flanged, rhythmical sucking.  Audible Swallowing: A few with stimulation  Type of Nipple: Everted at rest and after stimulation  Comfort (Breast/Nipple): Soft / non-tender     Hold (Positioning): Assistance needed to correctly position infant at breast and maintain latch.  LATCH Score: 8  Lactation Tools Discussed/Used     Consult Status Consult Status: Follow-up Date: 12/10/16 Follow-up type: In-patient    Maryruth HancockKelly Suzanne [facility] 12/09/2016, 12:30 PM

## 2016-12-09 NOTE — H&P (Signed)
Pt is 36 y/o asian female G3P2002 at term who was admitted in labor. On admission she was 5 cm. Her PNC was complicated by a cystic hygroma early in preg which resolved spont. She had a nl NIPT.She failed the one hour GTT and passed the 3 hour GTT. She had a +GBS culture.  PMHX: see hollister PE: VSSAF        HEENT-wnl        ABD- gravid, palp ctxs        FHTs- reactive, wnl IMP/ IUP at term with labor.          +GBS         H.O. Cystic hygroma, Normal NIPT PLAN/ Admit            ABXs            Expect SVD

## 2016-12-10 LAB — RPR: RPR Ser Ql: NONREACTIVE

## 2016-12-10 NOTE — Progress Notes (Signed)
Post Partum Day 1 Subjective: no complaints, up ad lib, voiding, tolerating PO, + flatus and breast feeding  Objective: Blood pressure 104/75, pulse 64, temperature 98.2 F (36.8 C), temperature source Oral, resp. rate 18, height 5\' 3"  (1.6 m), weight 75.3 kg (166 lb), last menstrual period 03/08/2016, SpO2 99 %, unknown if currently breastfeeding.  Physical Exam:  General: alert, cooperative and no distress Lochia: appropriate Uterine Fundus: firm perineum healing well, no significant drainage, no dehiscence, no significant erythema DVT Evaluation: No evidence of DVT seen on physical exam. Negative Homan's sign. No cords or calf tenderness. No significant calf/ankle edema.   Recent Labs  12/09/16 0605  HGB 15.3*  HCT 42.3    Assessment/Plan: Plan for discharge tomorrow and Breastfeeding   LOS: 1 day   Faithe Ariola STACIA 12/10/2016, 7:37 AM

## 2016-12-10 NOTE — Lactation Note (Signed)
This note was copied from a baby's chart. Lactation Consultation Note Follow up visit at 31 hours of age.  Mom reports she is using DEBP and plans to pump every 2-3 hours until her milk comes in.  Baby has been having difficulty with latch so mom will pump until milk comes in and formula feed with bottle as needed until she has enough milk.  Mom has NS that she has not been using. LC provided cleaning supplies and reviewed use of pump and cleaning.  Mom denies further concerns at this time.    Patient Name: [name] [id number]'s Date: 12/10/2016 Reason for consult: Follow-up assessment;Difficult latch   Maternal Data    Feeding Feeding Type: Breast Milk with Formula added  LATCH Score/Interventions                      Lactation Tools Discussed/Used Pump Review: Setup, frequency, and cleaning;Milk Storage Date initiated:: 12/10/16   Consult Status Consult Status: Follow-up Date: 12/11/16 Follow-up type: In-patient    Eugenie Harewood, Arvella MerlesJana Lynn 12/10/2016, 5:46 PM

## 2016-12-10 NOTE — Lactation Note (Signed)
This note was copied from a baby's chart. Lactation Consultation Note  Patient Name: Girl Verta Ellenrang Card ZOXWR'UToday's Date: 12/10/2016 Reason for consult: Follow-up assessment;Difficult latch RN asked LC for assist with latching baby. RN reports baby very fussy and will not latch. LC tried 20/24 nipple shield to latch baby but baby to fussy to latch. Using curved tipped syringe, demonstrated to parents how to finger feed. With finger feeding it took long time for baby to organize her suck, but once she did she was able to take 5 ml of Alimentum without difficulty. Baby settled down and placed STS on Mom. RN to set up DEBP for Mom to start pumping every 3 hours for 15 minutes. Mom to call out for assist with next feeding.   Maternal Data    Feeding Feeding Type: Formula Length of feed: 0 min  LATCH Score/Interventions Latch: Grasps breast easily, tongue down, lips flanged, rhythmical sucking.  Audible Swallowing: A few with stimulation  Type of Nipple: Everted at rest and after stimulation  Comfort (Breast/Nipple): Soft / non-tender     Hold (Positioning): No assistance needed to correctly position infant at breast.  LATCH Score: 9  Lactation Tools Discussed/Used     Consult Status Consult Status: Follow-up Date: 12/10/16 Follow-up type: In-patient    Alfred LevinsGranger, Cameryn Chrisley Ann 12/10/2016, 1:46 PM

## 2016-12-11 MED ORDER — DOCUSATE SODIUM 100 MG PO CAPS: 100.0000 mg | ORAL_CAPSULE | Freq: Two times a day (BID) | ORAL | 0 refills | Status: DC

## 2016-12-11 MED ORDER — IBUPROFEN 600 MG PO TABS: 600.0000 mg | ORAL_TABLET | Freq: Four times a day (QID) | ORAL | 0 refills | Status: DC | PRN

## 2016-12-11 MED ORDER — OXYCODONE-ACETAMINOPHEN 5-325 MG PO TABS: 1.0000 | ORAL_TABLET | ORAL | 0 refills | Status: DC | PRN

## 2016-12-11 NOTE — Lactation Note (Signed)
This note was copied from a baby's chart. Lactation Consultation Note  Patient Name: Phyllis Reed Reason for consult: Follow-up assessment;Difficult latch Mom reports she plans to pump/bottle till her milk comes in. Declined assist with latch before d/c home. Mom has DEBP for home use. LC advised to continue to pump every 3 hours for 15-20 minutes. Engorgement care reviewed if needed. Advised of OP services if desired and support group. Breast milk storage guidelines discussed.   Maternal Data    Feeding    LATCH Score/Interventions                      Lactation Tools Discussed/Used Tools: Pump Breast pump type: Double-Electric Breast Pump   Consult Status Consult Status: Complete Date: 12/11/16 Follow-up type: In-patient    Alfred LevinsGranger, Phyllis Reed Reed, 10:59 AM

## 2016-12-11 NOTE — Discharge Summary (Signed)
Obstetric Discharge Summary Reason for Admission: onset of labor Prenatal Procedures: NST and ultrasound Intrapartum Procedures: spontaneous vaginal delivery Postpartum Procedures: none Complications-Operative and Postpartum: 2nd degree perineal laceration Hemoglobin  Date Value Ref Range Status  12/09/2016 15.3 (H) 12.0 - 15.0 g/dL Final   HCT  Date Value Ref Range Status  12/09/2016 42.3 36.0 - 46.0 % Final    Physical Exam:  General: alert, cooperative and appears stated age 83Lochia: appropriate Uterine Fundus: firm Incision: healing well DVT Evaluation: No evidence of DVT seen on physical exam.  Discharge Diagnoses: Term Pregnancy-delivered  Discharge Information: Date: 12/11/2016 Activity: pelvic rest Diet: routine Medications: Ibuprofen, Colace and Percocet Condition: improved Instructions: refer to practice specific booklet Discharge to: home Follow-up Information    Levi AlandANDERSON,MARK E, MD Follow up in 4 week(s).   Specialty:  Obstetrics and Gynecology Contact information: [address] [place] [phone]           Newborn Data: Live born female  Birth Weight: 7 lb 11.6 oz (3505 g) APGAR: 9, 10  Home with mother.  Phyllis Reed H. 12/11/2016, 8:57 AM

## 2017-01-20 DIAGNOSIS — R87615 Unsatisfactory cytologic smear of cervix: Secondary | ICD-10-CM | POA: Diagnosis not present

## 2017-11-18 DIAGNOSIS — R5383 Other fatigue: Secondary | ICD-10-CM | POA: Diagnosis not present

## 2017-11-18 DIAGNOSIS — Z833 Family history of diabetes mellitus: Secondary | ICD-10-CM | POA: Diagnosis not present

## 2019-11-24 ENCOUNTER — Institutional Professional Consult (permissible substitution): Payer: 59 | Admitting: Plastic Surgery

## 2019-11-25 ENCOUNTER — Other Ambulatory Visit: Payer: Self-pay

## 2019-11-25 ENCOUNTER — Ambulatory Visit (INDEPENDENT_AMBULATORY_CARE_PROVIDER_SITE_OTHER): Payer: 59 | Admitting: Plastic Surgery

## 2019-11-25 ENCOUNTER — Encounter: Payer: Self-pay | Admitting: Plastic Surgery

## 2019-11-25 DIAGNOSIS — S0181XA Laceration without foreign body of other part of head, initial encounter: Secondary | ICD-10-CM | POA: Insufficient documentation

## 2019-11-25 NOTE — Progress Notes (Signed)
     Patient ID: Phyllis Reed, female    DOB: 06-26-1981, 39 y.o.   MRN: [id number]   Chief Complaint  Patient presents with  . Skin Problem    The patient is a 39 year old female here for evaluation of a laceration on her face.  On Monday she went snowboarding.  She was struck by another snowboarder.  She sustained a laceration of her medial forehead.  The laceration is stellate in about 3 cm in total size.  It was repaired with Prolene.  A combination of simple interrupted and running sutures were used.  Nothing appears to be infected.  She is keeping it clean and using Neosporin on the area.  She has some swelling around the nose and the periorbital area.  She is otherwise in good health.  She denies any other injuries.   Review of Systems  Constitutional: Negative for activity change and appetite change.  HENT: Negative.   Eyes: Negative.   Respiratory: Negative for chest tightness.   Cardiovascular: Negative.   Gastrointestinal: Negative.   Genitourinary: Negative.   Musculoskeletal: Negative.   Skin: Negative for color change and wound.  Psychiatric/Behavioral: Negative.     Past Medical History:  Diagnosis Date  . No pertinent past medical history     Past Surgical History:  Procedure Laterality Date  . HEMORRHOID SURGERY    . INDUCED ABORTION       No current outpatient medications on file.   Objective:   Vitals:   11/25/19 0913  BP: 109/73  Pulse: 64  Temp: 97.8 F (36.6 C)  SpO2: 98%    Physical Exam Vitals and nursing note reviewed.  Constitutional:      Appearance: Normal appearance.  HENT:     Head: Normocephalic.   Cardiovascular:     Rate and Rhythm: Normal rate.     Pulses: Normal pulses.  Pulmonary:     Effort: Pulmonary effort is normal.  Neurological:     General: No focal deficit present.     Mental Status: She is alert and oriented to person, place, and time.  Psychiatric:        Mood and Affect: Mood normal.        Behavior: Behavior  normal.        Thought Content: Thought content normal.     Assessment & Plan:  Facial laceration, initial encounter  2 sutures were removed from the inferior portion of the laceration.  I think it is a little soon to remove the other ones.  I had like to have that done on Monday.  I have recommended Mederma and silicone sheets to start in the next several weeks.  Of course avoid sun exposure to the lacerated area.  Pictures were obtained of the patient and placed in the chart with the patient's or guardian's permission.  Peggye Form, DO   The 21st Century Cures Act was signed into law in 2016 which includes the topic of electronic health records.  This provides immediate access to information in MyChart.  This includes consultation notes, operative notes, office notes, lab results and pathology reports.  If you have any questions about what you read please let us know at your next visit or call us at the office.  We are right here with you.

## 2019-11-28 ENCOUNTER — Encounter: Payer: Self-pay | Admitting: Plastic Surgery

## 2019-11-28 ENCOUNTER — Other Ambulatory Visit: Payer: Self-pay

## 2019-11-28 ENCOUNTER — Ambulatory Visit (INDEPENDENT_AMBULATORY_CARE_PROVIDER_SITE_OTHER): Payer: 59 | Admitting: Plastic Surgery

## 2019-11-28 VITALS — BP 109/69 | HR 79 | Temp 97.7°F | Ht 64.0 in | Wt 246.0 lb

## 2019-11-28 DIAGNOSIS — S0181XD Laceration without foreign body of other part of head, subsequent encounter: Secondary | ICD-10-CM

## 2019-11-28 NOTE — Progress Notes (Signed)
The patient is a 39 year old female for follow-up after laceration on her face. On 11/21/2019 she was snowboarding when she was struck by another snowboarder sustaining a laceration of her medial forehead.  Today laceration is healing very well, c/d/i. No signs of infection, seroma/hematoma, drainage, or redness. Remaining sutures removed. May begin using silicone or mederma in 1 week.  Call office with any questions/concerns.  The 21st Century Cures Act was signed into law in 2016 which includes the topic of electronic health records.  This provides immediate access to information in MyChart.  This includes consultation notes, operative notes, office notes, lab results and pathology reports.  If you have any questions about what you read please let us know at your next visit or call us at the office.  We are right here with you.

## 2020-02-03 ENCOUNTER — Ambulatory Visit: Payer: 59

## 2020-02-07 ENCOUNTER — Ambulatory Visit: Payer: 59

## 2023-03-01 ENCOUNTER — Emergency Department (HOSPITAL_BASED_OUTPATIENT_CLINIC_OR_DEPARTMENT_OTHER): Payer: Managed Care, Other (non HMO)

## 2023-03-01 ENCOUNTER — Encounter (HOSPITAL_BASED_OUTPATIENT_CLINIC_OR_DEPARTMENT_OTHER): Payer: Self-pay | Admitting: Emergency Medicine

## 2023-03-01 ENCOUNTER — Other Ambulatory Visit: Payer: Self-pay

## 2023-03-01 ENCOUNTER — Emergency Department (HOSPITAL_BASED_OUTPATIENT_CLINIC_OR_DEPARTMENT_OTHER)
Admission: EM | Admit: 2023-03-01 | Discharge: 2023-03-02 | Disposition: A | Discharge status: Home / Self Care | Payer: Managed Care, Other (non HMO) | Attending: Emergency Medicine | Admitting: Emergency Medicine

## 2023-03-01 DIAGNOSIS — E876 Hypokalemia: Secondary | ICD-10-CM | POA: Insufficient documentation

## 2023-03-01 DIAGNOSIS — L299 Pruritus, unspecified: Secondary | ICD-10-CM | POA: Insufficient documentation

## 2023-03-01 DIAGNOSIS — R601 Generalized edema: Secondary | ICD-10-CM | POA: Diagnosis present

## 2023-03-01 DIAGNOSIS — R52 Pain, unspecified: Secondary | ICD-10-CM | POA: Insufficient documentation

## 2023-03-01 LAB — CBC WITH DIFFERENTIAL/PLATELET
Abs Immature Granulocytes: 0.1 10*3/uL — ABNORMAL HIGH (ref 0.00–0.07)
Basophils Absolute: 0 10*3/uL (ref 0.0–0.1)
Basophils Relative: 0 %
Eosinophils Absolute: 0.6 10*3/uL — ABNORMAL HIGH (ref 0.0–0.5)
Eosinophils Relative: 6 %
HCT: 35.6 % — ABNORMAL LOW (ref 36.0–46.0)
Hemoglobin: 12.2 g/dL (ref 12.0–15.0)
Immature Granulocytes: 1 %
Lymphocytes Relative: 20 %
Lymphs Abs: 1.8 10*3/uL (ref 0.7–4.0)
MCH: 28.8 pg (ref 26.0–34.0)
MCHC: 34.3 g/dL (ref 30.0–36.0)
MCV: 84 fL (ref 80.0–100.0)
Monocytes Absolute: 0.5 10*3/uL (ref 0.1–1.0)
Monocytes Relative: 5 %
Neutro Abs: 6.3 10*3/uL (ref 1.7–7.7)
Neutrophils Relative %: 68 %
Platelets: 374 10*3/uL (ref 150–400)
RBC: 4.24 MIL/uL (ref 3.87–5.11)
RDW: 11.7 % (ref 11.5–15.5)
WBC: 9.3 10*3/uL (ref 4.0–10.5)
nRBC: 0 % (ref 0.0–0.2)

## 2023-03-01 LAB — COMPREHENSIVE METABOLIC PANEL
ALT: 15 U/L (ref 0–44)
AST: 22 U/L (ref 15–41)
Albumin: 4.1 g/dL (ref 3.5–5.0)
Alkaline Phosphatase: 52 U/L (ref 38–126)
Anion gap: 8 (ref 5–15)
BUN: 14 mg/dL (ref 6–20)
CO2: 26 mmol/L (ref 22–32)
Calcium: 9 mg/dL (ref 8.9–10.3)
Chloride: 104 mmol/L (ref 98–111)
Creatinine, Ser: 0.57 mg/dL (ref 0.44–1.00)
GFR, Estimated: >60 mL/min (ref >60)
Glucose, Bld: 92 mg/dL (ref 70–99)
Potassium: 3.4 mmol/L — ABNORMAL LOW (ref 3.5–5.1)
Sodium: 138 mmol/L (ref 135–145)
Total Bilirubin: 0.3 mg/dL (ref 0.3–1.2)
Total Protein: 7.1 g/dL (ref 6.5–8.1)

## 2023-03-01 LAB — TSH: TSH: 2.396 u[IU]/mL (ref 0.350–4.500)

## 2023-03-01 LAB — URINALYSIS, ROUTINE W REFLEX MICROSCOPIC
Bacteria, UA: NONE SEEN
Bilirubin Urine: NEGATIVE
Glucose, UA: NEGATIVE mg/dL
Ketones, ur: NEGATIVE mg/dL
Leukocytes,Ua: NEGATIVE
Nitrite: NEGATIVE
Protein, ur: NEGATIVE mg/dL
Specific Gravity, Urine: 1.013 (ref 1.005–1.030)
pH: 7 (ref 5.0–8.0)

## 2023-03-01 LAB — PREGNANCY, URINE: Preg Test, Ur: NEGATIVE

## 2023-03-01 LAB — BRAIN NATRIURETIC PEPTIDE: B Natriuretic Peptide: 26.8 pg/mL (ref 0.0–100.0)

## 2023-03-01 MED ORDER — FAMOTIDINE 20 MG PO TABS
20.0000 mg | ORAL_TABLET | Freq: Once | ORAL | Status: AC
  Administered 2023-03-01: 20 mg via ORAL
  Filled 2023-03-01: qty 1

## 2023-03-01 MED ORDER — DEXAMETHASONE 4 MG PO TABS
6.0000 mg | ORAL_TABLET | Freq: Once | ORAL | Status: AC
  Administered 2023-03-01: 6 mg via ORAL
  Filled 2023-03-01: qty 2

## 2023-03-01 MED ORDER — POTASSIUM CHLORIDE CRYS ER 20 MEQ PO TBCR
40.0000 meq | EXTENDED_RELEASE_TABLET | Freq: Once | ORAL | Status: AC
  Administered 2023-03-01: 40 meq via ORAL
  Filled 2023-03-01: qty 2

## 2023-03-01 MED ORDER — DIPHENHYDRAMINE HCL 25 MG PO CAPS
25.0000 mg | ORAL_CAPSULE | Freq: Once | ORAL | Status: AC
  Administered 2023-03-01: 25 mg via ORAL
  Filled 2023-03-01: qty 1

## 2023-03-01 NOTE — ED Provider Notes (Signed)
[place] EMERGENCY DEPARTMENT AT [facility] Provider Note   CSN: [id number] Arrival date & time: 03/01/23  2210     History  Chief Complaint  Patient presents with   Edema    Phyllis Reed is a 42 y.o. female with no past history who presents to the ED complaining of generalized body swelling, itching, and generalized bodyaches since yesterday morning.  No known new exposure.  She notes that she feels achy all over and itchy particularly to her feet.  No new foods or medications.  No history of the symptoms.  Patient has been eating and drinking normally.  No recent travel.  States that she works in [employer] and is on her feet a lot but has had no recent change in her activity level.  Urinating appropriately at home.  No known medical comorbidities.  Family history includes diabetes in her mother but no heart, kidney, or other significant medical issues.  She denies associated fever, chills, nausea, vomiting, diarrhea, chest pain, shortness of breath, abdominal pain, or other symptoms.  No substance abuse. She has been taking benadryl at home without relief of symptoms. Last dose this morning. Pt does not currently have a PCP.       Home Medications No daily prescription medications  Allergies    Patient has no known allergies.    Review of Systems   Review of Systems  All other systems reviewed and are negative.   Physical Exam Updated Vital Signs BP (!) 120/57 (BP Location: Right Arm)   Pulse 94   Temp 99 F (37.2 C) (Oral)   Resp 20   Ht 5\' 3"  (1.6 m)   Wt 68 kg   LMP 03/01/2023   SpO2 100%   BMI 26.57 kg/m  Physical Exam Vitals and nursing note reviewed.  Constitutional:      General: She is not in acute distress.    Appearance: Normal appearance. She is not ill-appearing, toxic-appearing or diaphoretic.  HENT:     Head: Normocephalic and atraumatic.     Right Ear: External ear normal.     Left Ear: External ear normal.     Nose: Nose  normal.     Mouth/Throat:     Mouth: Mucous membranes are moist.     Pharynx: Oropharynx is clear. No oropharyngeal exudate or posterior oropharyngeal erythema.  Eyes:     General: No scleral icterus.       Right eye: No discharge.        Left eye: No discharge.     Extraocular Movements: Extraocular movements intact.     Conjunctiva/sclera: Conjunctivae normal.     Pupils: Pupils are equal, round, and reactive to light.  Cardiovascular:     Rate and Rhythm: Normal rate and regular rhythm.     Heart sounds: No murmur heard. Pulmonary:     Effort: Pulmonary effort is normal. No respiratory distress.     Breath sounds: Normal breath sounds. No stridor. No wheezing, rhonchi or rales.  Abdominal:     General: Abdomen is flat. There is no distension.     Palpations: Abdomen is soft.     Tenderness: There is no abdominal tenderness. There is no right CVA tenderness, left CVA tenderness, guarding or rebound.  Musculoskeletal:        General: Normal range of motion.     Cervical back: Normal range of motion and neck supple. No rigidity.     Right lower leg: No edema.  Left lower leg: No edema.     Comments: No calf tenderness bilaterally  Lymphadenopathy:     Cervical: No cervical adenopathy.  Skin:    General: Skin is warm and dry.     Capillary Refill: Capillary refill takes less than 2 seconds.     Coloration: Skin is not jaundiced or pale.     Findings: No lesion or rash.  Neurological:     General: No focal deficit present.     Mental Status: She is alert and oriented to person, place, and time. Mental status is at baseline.  Psychiatric:        Mood and Affect: Mood normal.        Behavior: Behavior normal.     ED Results / Procedures / Treatments   Labs (all labs ordered are listed, but only abnormal results are displayed) Labs Reviewed  CBC WITH DIFFERENTIAL/PLATELET - Abnormal; Notable for the following components:      Result Value   HCT 35.6 (*)    Eosinophils  Absolute 0.6 (*)    Abs Immature Granulocytes 0.10 (*)    All other components within normal limits  COMPREHENSIVE METABOLIC PANEL - Abnormal; Notable for the following components:   Potassium 3.4 (*)    All other components within normal limits  URINALYSIS, ROUTINE W REFLEX MICROSCOPIC - Abnormal; Notable for the following components:   Color, Urine COLORLESS (*)    Hgb urine dipstick SMALL (*)    All other components within normal limits  PREGNANCY, URINE  TSH  BRAIN NATRIURETIC PEPTIDE    EKG None  Radiology DG Chest Portable 1 View  Result Date: 03/01/2023 CLINICAL DATA:  Generalized peripheral edema, initial encounter EXAM: PORTABLE CHEST 1 VIEW COMPARISON:  None Available. FINDINGS: Cardiac shadow is within normal limits. The lungs are well aerated bilaterally. No focal infiltrate or effusion is seen. No bony abnormality is noted. IMPRESSION: No acute abnormality noted. Electronically Signed   By: Alcide Clever M.D.   On: 03/01/2023 23:41    Procedures Procedures    Medications Ordered in ED Medications  famotidine (PEPCID) tablet 20 mg (20 mg Oral Given 03/01/23 2316)  diphenhydrAMINE (BENADRYL) capsule 25 mg (25 mg Oral Given 03/01/23 2316)  dexamethasone (DECADRON) tablet 6 mg (6 mg Oral Given 03/01/23 2316)  potassium chloride SA (KLOR-CON M) CR tablet 40 mEq (40 mEq Oral Given 03/01/23 2344)    ED Course/ Medical Decision Making/ A&P                             Medical Decision Making Amount and/or Complexity of Data Reviewed Labs: ordered. Decision-making details documented in ED Course. Radiology: ordered. Decision-making details documented in ED Course.  Risk Prescription drug management.   Medical Decision Making:   Phyllis Reed is a 42 y.o. female who presented to the ED today with edema detailed above.     Complete initial physical exam performed, notably the patient was in no acute distress.  Lungs clear to auscultation.  Regular rate and rhythm.  No  identifiable areas of edema.  Patent airway without any swelling.  No significant adenopathy.  Abdomen soft and nontender.  Patient neurologically intact. Reviewed and confirmed nursing documentation for past medical history, family history, social history.    Initial Assessment:   With the patient's presentation, differential diagnosis includes but is not limited to AKI, CKD, kidney failure, liver disease/failure, thyroid disease, heart failure, pulmonary edema, venous insufficiency,  viral syndrome, dehydration, malnutrition, pregnancy, DVT. This is most consistent with an acute complicated illness  Initial Plan:  Screening labs including CBC and Metabolic panel to evaluate for infectious or metabolic etiology of disease.  Urinalysis with reflex culture ordered to evaluate for UTI or relevant urologic/nephrologic pathology.  CXR to evaluate for structural/infectious intrathoracic pathology.  TSH to evaluate for thyroid disease BNP to evaluate for fluid overload Symptomatic manage Objective evaluation as below reviewed   Initial Study Results:   Laboratory  All laboratory results reviewed without evidence of clinically relevant pathology.   Exceptions include: K3.4, UA positive for hemoglobin (patient states that she is on her menstrual cycle)  Radiology:  All images reviewed independently. Agree with radiology report at this time.   DG Chest Portable 1 View  Result Date: 03/01/2023 CLINICAL DATA:  Generalized peripheral edema, initial encounter EXAM: PORTABLE CHEST 1 VIEW COMPARISON:  None Available. FINDINGS: Cardiac shadow is within normal limits. The lungs are well aerated bilaterally. No focal infiltrate or effusion is seen. No bony abnormality is noted. IMPRESSION: No acute abnormality noted. Electronically Signed   By: Alcide Clever M.D.   On: 03/01/2023 23:41      Final Assessment and Plan:   42 year old female presents to the ED complaining of generalized body edema, itching,  body aches.  On exam, no lower extremity edema.  Abdomen soft and nontender.  Does not appear distended.  No fluid wave or signs of edema they are any better.  Regular rate and rhythm.  Lungs clear to auscultation.  No significant adenopathy.  Widely patent airway.  Low suspicion for severe allergic process with patient's description of symptoms.  No known trigger of symptoms.  Workup ordered as above for further evaluation.  Low suspicion for acute organ failure or emergent process given patient's presentation.  Normal CBC, normal metabolic panel.  Normal TSH.  Normal BNP.  Chest x-ray negative.  Minimal hypokalemia which was repleted.  Overall very reassuring lab work and believe that patient is stable to be discharged home.  Discussed case with attending physician who cosigned this note who agreed with management.  Will provide patient with primary care provider to follow-up with for further assessment.  Patient expressed some frustration with no etiology of symptoms being found today.  Encourage patient to closely follow-up with primary care for further evaluation and management of her symptoms.  Strict ED return precautions given, all questions answered, and stable for discharge.   Clinical Impression:  1. Generalized edema   2. Pruritus   3. Body aches   4. Hypokalemia      Data Unavailable           Final Clinical Impression(s) / ED Diagnoses Final diagnoses:  Generalized edema  Pruritus  Body aches  Hypokalemia    Rx / DC Orders ED Discharge Orders     None         Tonette Lederer, PA-C 03/02/23 0001    [place], Adam, DO 03/02/23 1622

## 2023-03-01 NOTE — ED Triage Notes (Signed)
Pt via pv from home with generalized body edema since Satruday morning. Pt states she feels achy and that her arms, face, legs are all swollen. Pt alert & oriented, nad noted.

## 2023-03-02 NOTE — ED Notes (Signed)
RN reviewed discharge instructions with pt. Pt verbalized understanding and had no further questions. VSS upon discharge.  

## 2023-03-02 NOTE — Discharge Instructions (Signed)
Thank you for letting us take care of you today.  Your lab work was reassuring.  Your potassium level was slightly low but otherwise your lab work and chest x-ray were normal.  We did not see any signs of organ failure on your blood work today.  Your urine also did not look infected.  It is difficult to say what is causing your symptoms but it does not appear life-threatening at this time.  Please follow-up with primary care as we discussed.  If you develop any new or worsening symptoms such as difficulty breathing, chest pain, throat swelling, difficulty swallowing, severe abdominal pain, vomiting, diarrhea, or other new, concerning symptoms, please return to the nearest emergency department for reevaluation.
